# Patient Record
Sex: Female | Born: 1985 | Race: Asian | Hispanic: No | Marital: Single
Health system: Southern US, Community
[De-identification: ages and names within clinical notes are randomized; demographics above are authoritative.]

---

## 2007-12-31 ENCOUNTER — Ambulatory Visit (HOSPITAL_COMMUNITY): Admission: RE | Admit: 2007-12-31 | Discharge: 2007-12-31 | Payer: Self-pay | Admitting: Obstetrics & Gynecology

## 2008-01-15 ENCOUNTER — Ambulatory Visit (HOSPITAL_COMMUNITY): Admission: RE | Admit: 2008-01-15 | Discharge: 2008-01-15 | Payer: Self-pay | Admitting: Family Medicine

## 2008-01-19 ENCOUNTER — Ambulatory Visit: Payer: Self-pay | Admitting: Family Medicine

## 2008-01-19 ENCOUNTER — Inpatient Hospital Stay (HOSPITAL_COMMUNITY): Admission: AD | Admit: 2008-01-19 | Discharge: 2008-01-21 | Payer: Self-pay | Admitting: Obstetrics & Gynecology

## 2008-04-23 ENCOUNTER — Ambulatory Visit: Payer: Self-pay | Admitting: Advanced Practice Midwife

## 2008-04-23 ENCOUNTER — Inpatient Hospital Stay (HOSPITAL_COMMUNITY): Admission: AD | Admit: 2008-04-23 | Discharge: 2008-04-25 | Payer: Self-pay | Admitting: Obstetrics & Gynecology

## 2009-08-15 ENCOUNTER — Ambulatory Visit (HOSPITAL_COMMUNITY): Admission: RE | Admit: 2009-08-15 | Discharge: 2009-08-15 | Payer: Self-pay | Admitting: Family Medicine

## 2009-10-02 IMAGING — US US OB COMP +14 WK
2 series · 14 of 28 positions shown · non-contrast
Comparison: none

OBSTETRICAL ULTRASOUND:
 This ultrasound exam was performed in the [HOSPITAL] Ultrasound Department.  The OB US report was generated in the AS system, and faxed to the ordering physician.  This report is also available in [REDACTED] PACS.

[Series 1: us ob comp +14 wk · 12 of 71 slices shown (1 of 2)]
[im 3/71]
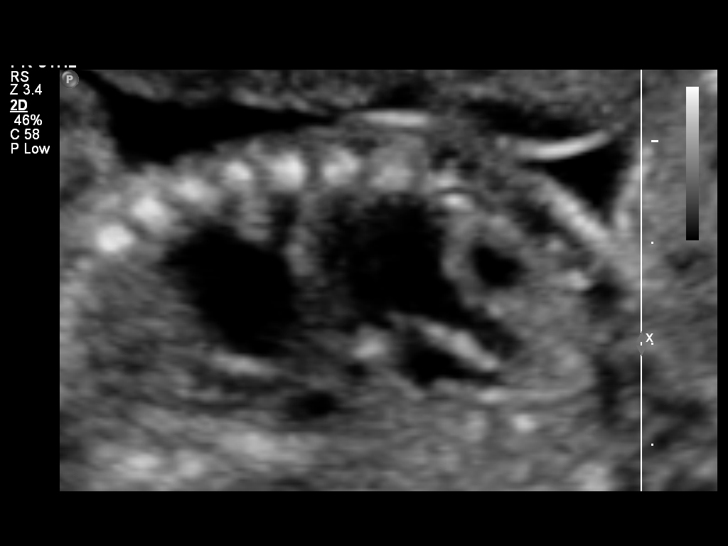
[im 9/71]
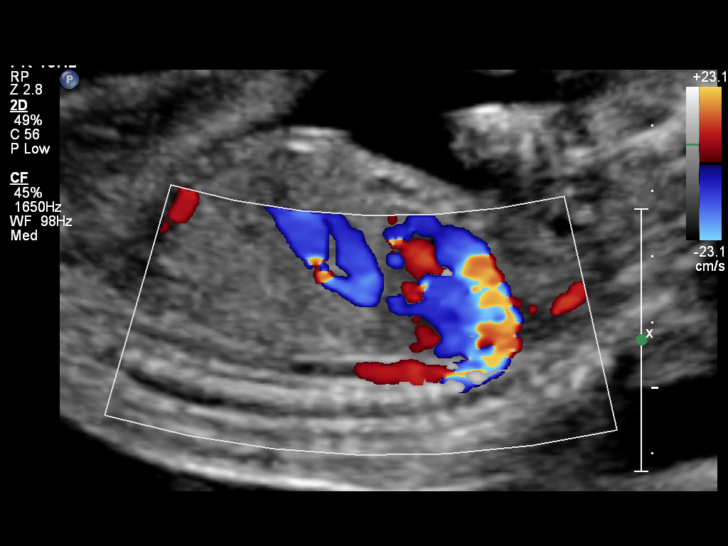
[im 15/71]
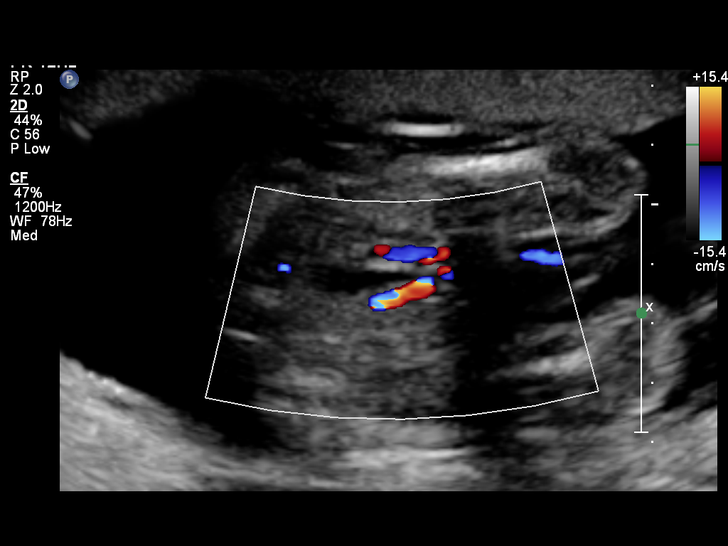
[im 21/71]
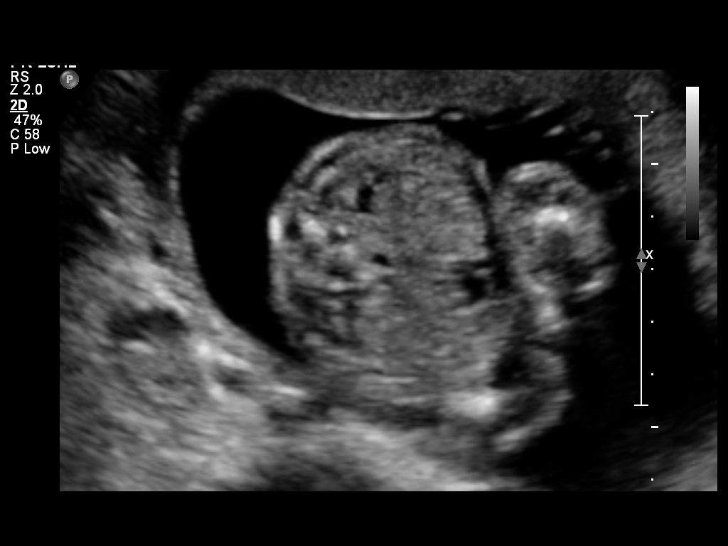
[im 27/71]
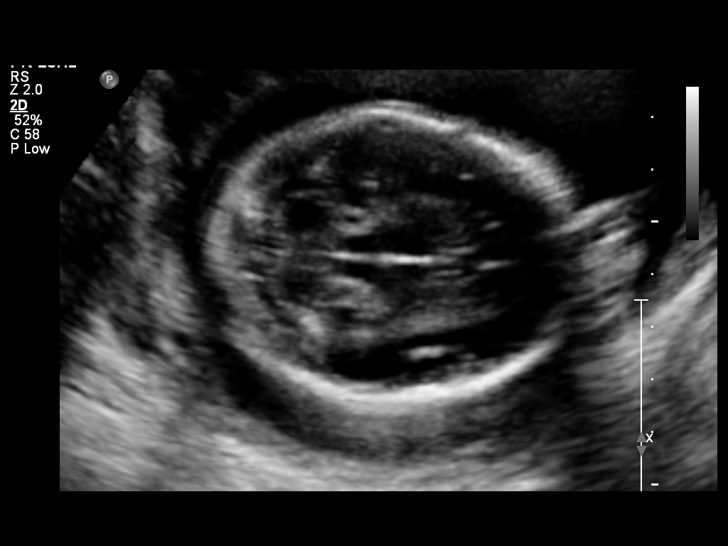
[im 33/71]
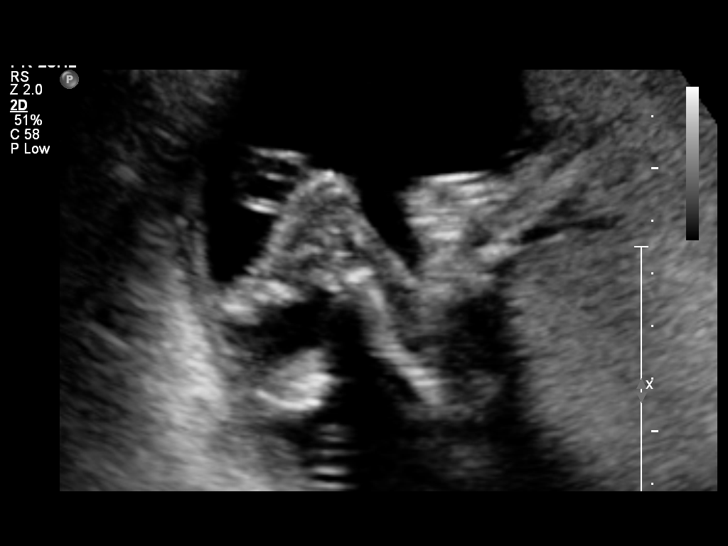
[im 38/71]
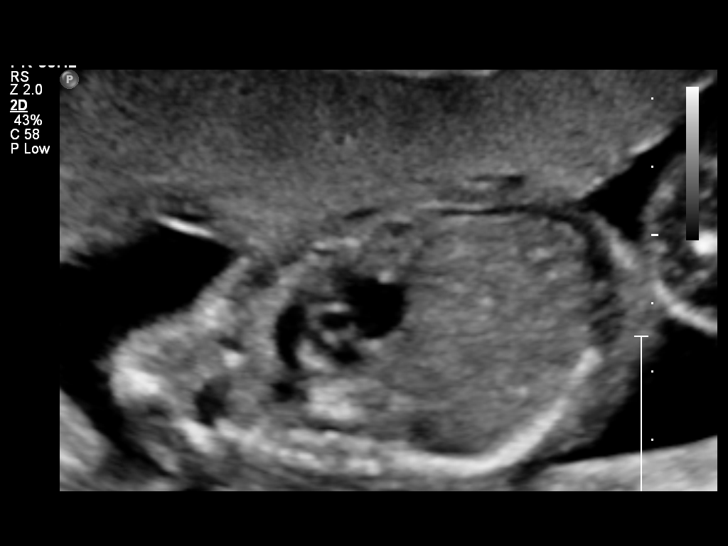
[im 44/71]
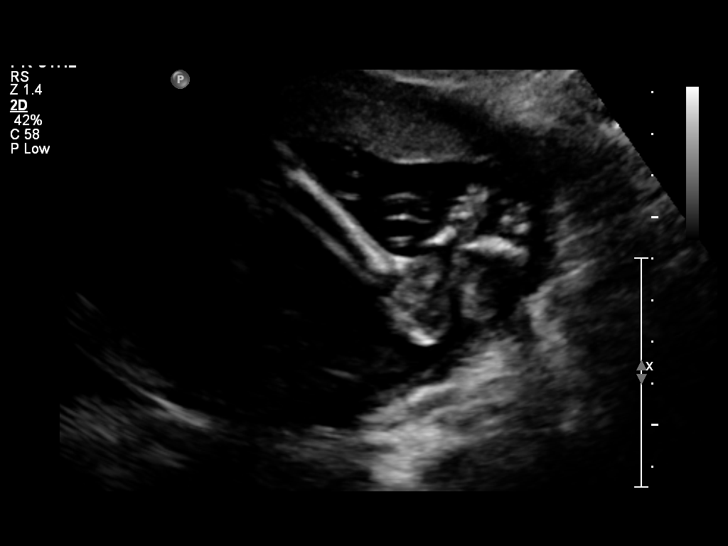
[im 50/71]
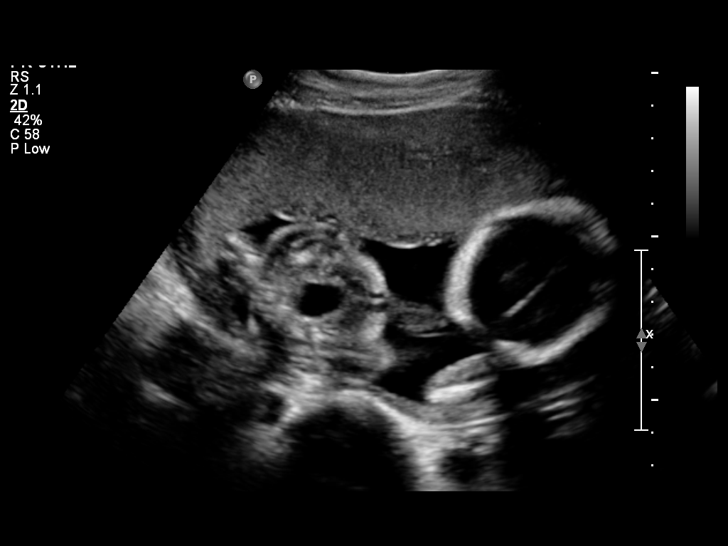
[im 56/71]
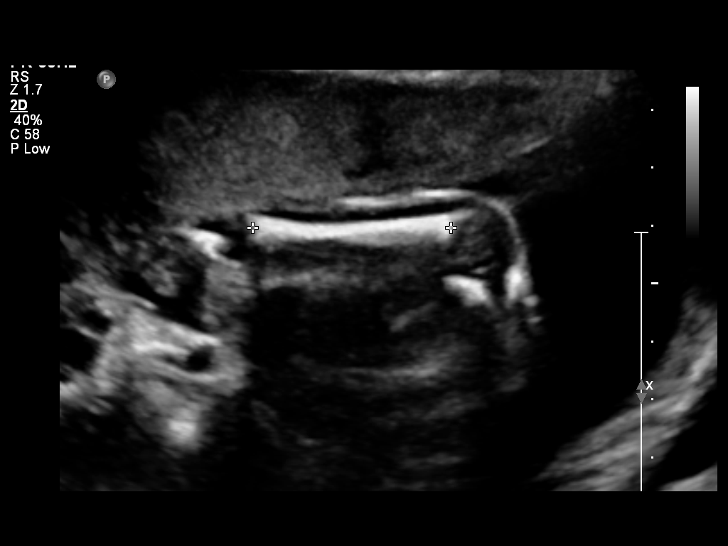
[im 62/71]
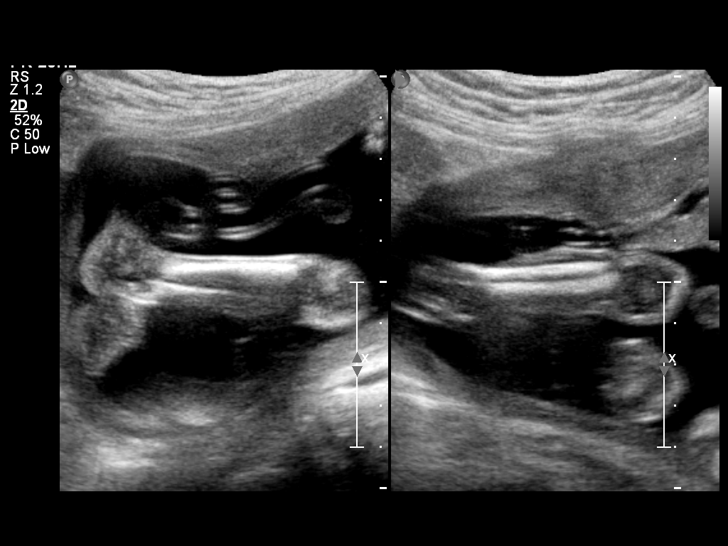
[im 68/71]
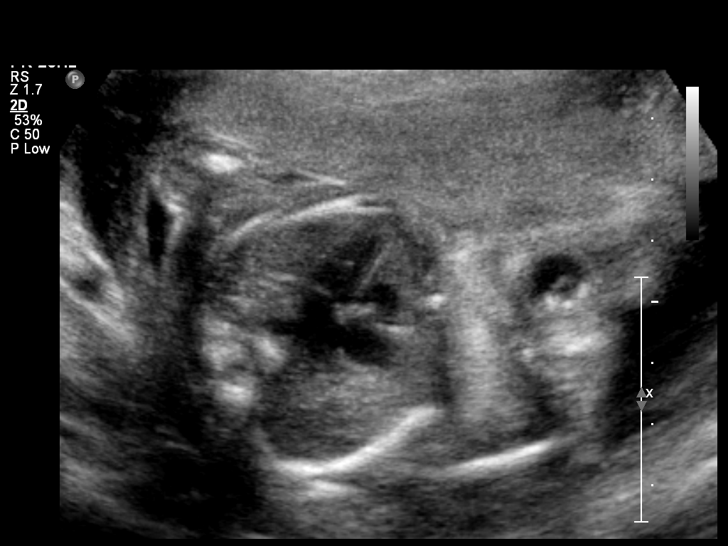

[Series 1: us ob comp +14 wk · 2 of 8 slices shown (2 of 2)]
[im 1/8]
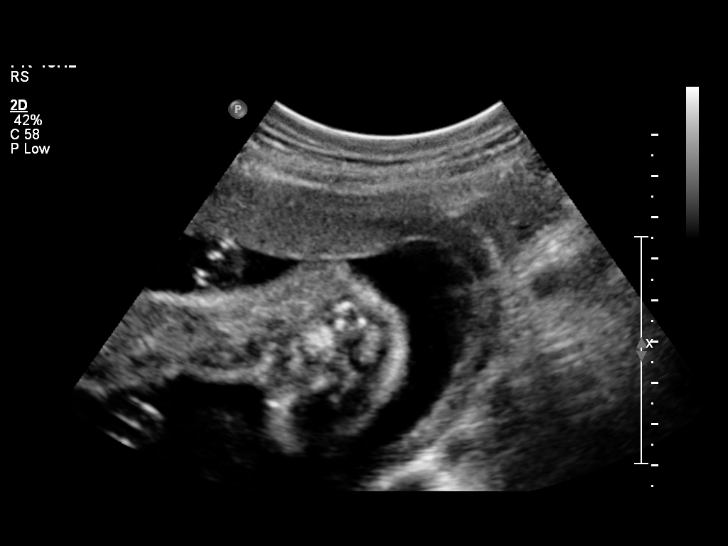
[im 8/8]
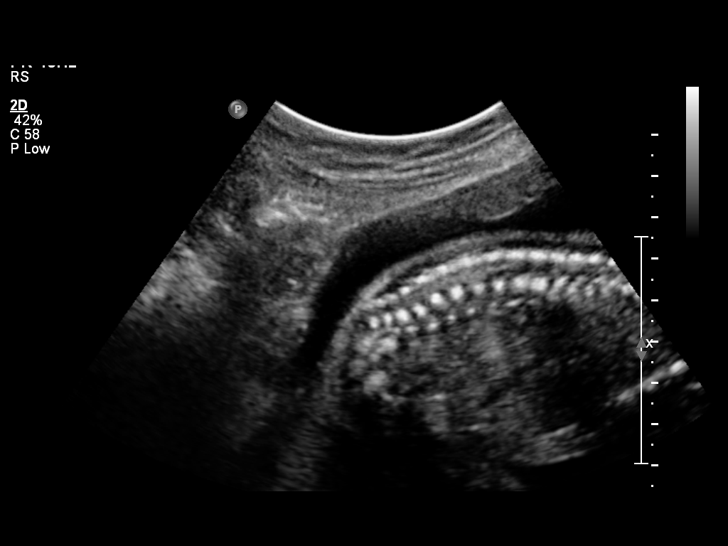

[14 of 28 positions shown; findings below may reference images not displayed]

IMPRESSION: See [facility] Obstetric US report.

## 2009-10-27 ENCOUNTER — Ambulatory Visit (HOSPITAL_COMMUNITY): Admission: RE | Admit: 2009-10-27 | Discharge: 2009-10-27 | Payer: Self-pay | Admitting: Family Medicine

## 2009-11-13 ENCOUNTER — Ambulatory Visit: Payer: Self-pay | Admitting: Obstetrics & Gynecology

## 2009-11-13 ENCOUNTER — Inpatient Hospital Stay (HOSPITAL_COMMUNITY): Admission: AD | Admit: 2009-11-13 | Discharge: 2009-11-15 | Payer: Self-pay | Admitting: Obstetrics & Gynecology

## 2010-04-26 LAB — CBC
HCT: 35.3 % — ABNORMAL LOW (ref 36.0–46.0)
Hemoglobin: 11.4 g/dL — ABNORMAL LOW (ref 12.0–15.0)
MCH: 24.6 pg — ABNORMAL LOW (ref 26.0–34.0)
MCHC: 32.2 g/dL (ref 30.0–36.0)
MCV: 76.4 fL — ABNORMAL LOW (ref 78.0–100.0)
RDW: 14.6 % (ref 11.5–15.5)

## 2010-04-26 LAB — RPR: RPR Ser Ql: NONREACTIVE

## 2010-05-24 LAB — CBC
MCV: 79.2 fL (ref 78.0–100.0)
RBC: 4.23 MIL/uL (ref 3.87–5.11)
RDW: 13.1 % (ref 11.5–15.5)

## 2010-05-24 LAB — RPR: RPR Ser Ql: NONREACTIVE

## 2010-06-26 NOTE — Discharge Summary (Signed)
[name], Sarah Deleon NO.:  [id number]   MEDICAL RECORD NO.:  [id number]          PATIENT TYPE:  INP   LOCATION:  9157                          FACILITY:  [facility]   PHYSICIAN:  Lazaro Arms, M.D.   DATE OF BIRTH:  24-Dec-1985   DATE OF ADMISSION:  01/19/2008  DATE OF DISCHARGE:  01/21/2008                               DISCHARGE SUMMARY   DISCHARGE DIAGNOSES:  1. Febrile illness.  2. Suprapubic abdominal pain.  3. Intrauterine pregnancy at 25 weeks.   REASON FOR HOSPITALIZATION:  Sarah Deleon is a 25 year old gravida 1,  para 0, who presented 31 weeks' gestation with an acute onset of  temperature to 102.7, headache, and nausea with 1 episode of vomiting.  She described the pain in her lower abdomen, most specifically over the  suprapubic area.  She denied any coughs or other upper respiratory  symptoms and during her evaluation in MAU, she was found to have a white  count of 16,000 with a normal urinalysis.  Because of the fever, her ill  appearance, and elevated white count, she was admitted for further  observation and IV antibiotics.   HOSPITAL COURSE:  The patient was admitted to the Antenatal Unit and  treated with IV antibiotics.  Initially, she was on cefotetan and  azithromycin, and this was later changed to ceftriaxone and  azithromycin.  The patient had a fever of 102.1 in the MAU and shortly  after arrival to the ward, showed a temperature of 100.7.  However,  after that the following 36 plus hours, she remained afebrile.  A CT  scan of her abdomen and pelvis were done for concern with appendicitis  and that was negative as well.  Her urine culture came back also  negative.  Her chlamydia and gonorrhea probe was negative and a wet prep  was done, which was also negative as well.  Given the above findings,  the etiology of her fever at this point unclear.  However, the patient  is feeling significantly better, has absolutely no abdominal pain, and  has not had a fever in well over 36 hours.  The patient will be  discharged home on empiric course of azithromycin, and the patient has  appointment to [facility] up with [facility] next week.   FINDINGS:  As per hospital course.   INSTRUCTIONS FOR PATIENT:  The patient was instructed on the use of  antibiotic as well as the need for followup with [facility].  The  patient's questions were answered and she voiced understanding of the  above instructions.      Odie Sera, DO  Electronically Signed     ______________________________  Lazaro Arms, M.D.   MC/MEDQ  D:  01/21/2008  T:  01/22/2008  Job:  [id number]

## 2010-06-26 NOTE — Discharge Summary (Signed)
Sarah Deleon, GOVERNALE NO.:  [id number]   MEDICAL RECORD NO.:  [id number]          PATIENT TYPE:  INP   LOCATION:  9157                          FACILITY:  [facility]   PHYSICIAN:  Odie Sera, DO      DATE OF BIRTH:  Jun 14, 1985   DATE OF ADMISSION:  01/19/2008  DATE OF DISCHARGE:  01/20/2009                               DISCHARGE SUMMARY   DISCHARGE DIAGNOSES:  1. Febrile illness of unclear etiology.  2. Suprapubic abdominal pain.  3. Intrauterine pregnancy at 25 weeks' gestation.   Dictation Ended At [employer].      Odie Sera, DO  Electronically Signed     MC/MEDQ  D:  01/21/2008  T:  01/21/2008  Job:  [id number]

## 2010-11-16 LAB — COMPREHENSIVE METABOLIC PANEL
ALT: 12 U/L (ref 0–35)
Alkaline Phosphatase: 59 U/L (ref 39–117)
BUN: 4 mg/dL — ABNORMAL LOW (ref 6–23)
CO2: 24 mEq/L (ref 19–32)
Calcium: 8.5 mg/dL (ref 8.4–10.5)
Creatinine, Ser: 0.39 mg/dL — ABNORMAL LOW (ref 0.4–1.2)
GFR calc Af Amer: >60 mL/min (ref >60)
Glucose, Bld: 83 mg/dL (ref 70–99)
Sodium: 136 mEq/L (ref 135–145)
Total Bilirubin: 0.8 mg/dL (ref 0.3–1.2)
Total Protein: 6.9 g/dL (ref 6.0–8.3)

## 2010-11-16 LAB — DIFFERENTIAL
Basophils Absolute: 0 10*3/uL (ref 0.0–0.1)
Eosinophils Absolute: 0.1 10*3/uL (ref 0.0–0.7)
Eosinophils Relative: 1 % (ref 0–5)
Eosinophils Relative: 1 % (ref 0–5)
Lymphocytes Relative: 5 % — ABNORMAL LOW (ref 12–46)
Lymphocytes Relative: 9 % — ABNORMAL LOW (ref 12–46)
Lymphs Abs: 0.8 10*3/uL (ref 0.7–4.0)
Monocytes Absolute: 0.7 10*3/uL (ref 0.1–1.0)
Monocytes Relative: 3 % (ref 3–12)
Neutro Abs: 10.8 10*3/uL — ABNORMAL HIGH (ref 1.7–7.7)
Neutro Abs: 14.5 10*3/uL — ABNORMAL HIGH (ref 1.7–7.7)
Neutrophils Relative %: 85 % — ABNORMAL HIGH (ref 43–77)

## 2010-11-16 LAB — URINALYSIS, ROUTINE W REFLEX MICROSCOPIC
Hgb urine dipstick: NEGATIVE
Protein, ur: NEGATIVE mg/dL

## 2010-11-16 LAB — CBC
HCT: 26.7 % — ABNORMAL LOW (ref 36.0–46.0)
HCT: 32.2 % — ABNORMAL LOW (ref 36.0–46.0)
Hemoglobin: 10.8 g/dL — ABNORMAL LOW (ref 12.0–15.0)
MCHC: 33.6 g/dL (ref 30.0–36.0)
Platelets: 173 10*3/uL (ref 150–400)
Platelets: 188 10*3/uL (ref 150–400)
RDW: 12.7 % (ref 11.5–15.5)
RDW: 12.7 % (ref 11.5–15.5)
WBC: 16 10*3/uL — ABNORMAL HIGH (ref 4.0–10.5)

## 2010-11-16 LAB — WET PREP, GENITAL: Trich, Wet Prep: NONE SEEN

## 2010-11-16 LAB — URINE CULTURE
Colony Count: NO GROWTH
Culture: NO GROWTH

## 2013-11-10 ENCOUNTER — Other Ambulatory Visit: Payer: Self-pay | Admitting: Physician Assistant

## 2013-11-10 DIAGNOSIS — N6315 Unspecified lump in the right breast, overlapping quadrants: Secondary | ICD-10-CM

## 2013-11-10 DIAGNOSIS — N6310 Unspecified lump in the right breast, unspecified quadrant: Principal | ICD-10-CM

## 2013-11-30 ENCOUNTER — Other Ambulatory Visit: Payer: Self-pay
